# Patient Record
Sex: Male | Born: 1960 | Race: Black or African American | Hispanic: No | Marital: Single | Smoking: Current every day smoker
Health system: Southern US, Community
[De-identification: ages and names within clinical notes are randomized; demographics above are authoritative.]

## PROBLEM LIST (undated history)

## (undated) DIAGNOSIS — W3400XA Accidental discharge from unspecified firearms or gun, initial encounter: Secondary | ICD-10-CM

## (undated) HISTORY — PX: EYE SURGERY: SHX253

---

## 2004-01-04 ENCOUNTER — Emergency Department (HOSPITAL_COMMUNITY): Admission: AD | Admit: 2004-01-04 | Discharge: 2004-01-04 | Payer: Self-pay | Admitting: Family Medicine

## 2014-12-14 ENCOUNTER — Emergency Department (HOSPITAL_COMMUNITY)
Admission: EM | Admit: 2014-12-14 | Discharge: 2014-12-15 | Disposition: A | Discharge status: Home / Self Care | Payer: Self-pay | Attending: Emergency Medicine | Admitting: Emergency Medicine

## 2014-12-14 ENCOUNTER — Encounter (HOSPITAL_COMMUNITY): Payer: Self-pay | Admitting: Emergency Medicine

## 2014-12-14 DIAGNOSIS — M79661 Pain in right lower leg: Secondary | ICD-10-CM

## 2014-12-14 DIAGNOSIS — Y998 Other external cause status: Secondary | ICD-10-CM | POA: Insufficient documentation

## 2014-12-14 DIAGNOSIS — Z720 Tobacco use: Secondary | ICD-10-CM | POA: Insufficient documentation

## 2014-12-14 DIAGNOSIS — Y9389 Activity, other specified: Secondary | ICD-10-CM | POA: Insufficient documentation

## 2014-12-14 DIAGNOSIS — S3992XA Unspecified injury of lower back, initial encounter: Secondary | ICD-10-CM | POA: Insufficient documentation

## 2014-12-14 DIAGNOSIS — S80811A Abrasion, right lower leg, initial encounter: Secondary | ICD-10-CM | POA: Insufficient documentation

## 2014-12-14 DIAGNOSIS — Y92410 Unspecified street and highway as the place of occurrence of the external cause: Secondary | ICD-10-CM | POA: Insufficient documentation
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
## 2014-12-15 ENCOUNTER — Emergency Department (HOSPITAL_COMMUNITY): Payer: Self-pay

## 2014-12-15 MED ORDER — IBUPROFEN 600 MG PO TABS: 600.0000 mg | ORAL_TABLET | Freq: Four times a day (QID) | ORAL | Status: DC | PRN

## 2015-12-06 ENCOUNTER — Emergency Department (HOSPITAL_COMMUNITY)
Admission: EM | Admit: 2015-12-06 | Discharge: 2015-12-06 | Disposition: A | Discharge status: Home / Self Care | Payer: No Typology Code available for payment source | Attending: Emergency Medicine | Admitting: Emergency Medicine

## 2015-12-06 ENCOUNTER — Encounter (HOSPITAL_COMMUNITY): Payer: Self-pay | Admitting: Emergency Medicine

## 2015-12-06 ENCOUNTER — Emergency Department (HOSPITAL_COMMUNITY): Payer: No Typology Code available for payment source

## 2015-12-06 DIAGNOSIS — Y92410 Unspecified street and highway as the place of occurrence of the external cause: Secondary | ICD-10-CM | POA: Insufficient documentation

## 2015-12-06 DIAGNOSIS — S199XXA Unspecified injury of neck, initial encounter: Secondary | ICD-10-CM | POA: Insufficient documentation

## 2015-12-06 DIAGNOSIS — S4991XA Unspecified injury of right shoulder and upper arm, initial encounter: Secondary | ICD-10-CM | POA: Insufficient documentation

## 2015-12-06 DIAGNOSIS — M542 Cervicalgia: Secondary | ICD-10-CM

## 2015-12-06 DIAGNOSIS — M545 Low back pain, unspecified: Secondary | ICD-10-CM

## 2015-12-06 DIAGNOSIS — Y9389 Activity, other specified: Secondary | ICD-10-CM | POA: Insufficient documentation

## 2015-12-06 DIAGNOSIS — Y998 Other external cause status: Secondary | ICD-10-CM | POA: Insufficient documentation

## 2015-12-06 DIAGNOSIS — F17200 Nicotine dependence, unspecified, uncomplicated: Secondary | ICD-10-CM | POA: Insufficient documentation

## 2015-12-06 DIAGNOSIS — S3992XA Unspecified injury of lower back, initial encounter: Secondary | ICD-10-CM | POA: Insufficient documentation

## 2015-12-06 MED ORDER — METHOCARBAMOL 500 MG PO TABS: 500.0000 mg | ORAL_TABLET | Freq: Two times a day (BID) | ORAL | Status: DC

## 2015-12-06 MED ORDER — ACETAMINOPHEN 325 MG PO TABS
650.0000 mg | ORAL_TABLET | Freq: Once | ORAL | Status: AC
  Administered 2015-12-06: 650 mg via ORAL
  Filled 2015-12-06: qty 2
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
## 2015-12-06 NOTE — ED Notes (Signed)
Patient transported to X-ray 

## 2016-05-20 ENCOUNTER — Ambulatory Visit (HOSPITAL_COMMUNITY)
Admission: EM | Admit: 2016-05-20 | Discharge: 2016-05-20 | Disposition: A | Discharge status: Home / Self Care | Payer: 59 | Attending: Emergency Medicine | Admitting: Emergency Medicine

## 2016-05-20 ENCOUNTER — Encounter (HOSPITAL_COMMUNITY): Payer: Self-pay | Admitting: Emergency Medicine

## 2016-05-20 ENCOUNTER — Ambulatory Visit (INDEPENDENT_AMBULATORY_CARE_PROVIDER_SITE_OTHER): Payer: 59

## 2016-05-20 DIAGNOSIS — M5410 Radiculopathy, site unspecified: Secondary | ICD-10-CM | POA: Diagnosis not present

## 2016-05-20 DIAGNOSIS — S39012A Strain of muscle, fascia and tendon of lower back, initial encounter: Secondary | ICD-10-CM

## 2016-05-20 MED ORDER — PREDNISONE 10 MG PO TABS: 20.0000 mg | ORAL_TABLET | Freq: Every day | ORAL | 0 refills | Status: DC

## 2016-05-20 MED ORDER — CYCLOBENZAPRINE HCL 10 MG PO TABS: 10.0000 mg | ORAL_TABLET | Freq: Two times a day (BID) | ORAL | 0 refills | Status: DC | PRN

## 2016-05-20 MED ORDER — OXYCODONE-ACETAMINOPHEN 10-325 MG PO TABS: 1.0000 | ORAL_TABLET | ORAL | 0 refills | Status: DC | PRN

## 2016-05-20 MED ORDER — KETOROLAC TROMETHAMINE 30 MG/ML IJ SOLN
INTRAMUSCULAR | Status: AC
  Filled 2016-05-20: qty 1

## 2016-05-20 MED ORDER — KETOROLAC TROMETHAMINE 30 MG/ML IJ SOLN
30.0000 mg | Freq: Once | INTRAMUSCULAR | Status: AC
  Administered 2016-05-20: 30 mg via INTRAMUSCULAR
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
## 2016-05-20 NOTE — ED Triage Notes (Signed)
[facility] a complaint of lower back pain that radiates into his left leg for the last 3 days. The patient denied any known injury to his back.

## 2016-11-16 ENCOUNTER — Emergency Department (HOSPITAL_COMMUNITY)
Admission: EM | Admit: 2016-11-16 | Discharge: 2016-11-16 | Disposition: A | Discharge status: Home / Self Care | Payer: 59 | Attending: Emergency Medicine | Admitting: Emergency Medicine

## 2016-11-16 ENCOUNTER — Encounter (HOSPITAL_COMMUNITY): Payer: Self-pay | Admitting: Emergency Medicine

## 2016-11-16 DIAGNOSIS — F17200 Nicotine dependence, unspecified, uncomplicated: Secondary | ICD-10-CM | POA: Insufficient documentation

## 2016-11-16 DIAGNOSIS — H1032 Unspecified acute conjunctivitis, left eye: Secondary | ICD-10-CM | POA: Insufficient documentation

## 2016-11-16 DIAGNOSIS — H11422 Conjunctival edema, left eye: Secondary | ICD-10-CM

## 2016-11-16 MED ORDER — TETRACAINE HCL 0.5 % OP SOLN
OPHTHALMIC | Status: AC
  Administered 2016-11-16: 1 [drp] via OPHTHALMIC
  Filled 2016-11-16: qty 4

## 2016-11-16 MED ORDER — FLUORESCEIN SODIUM 0.6 MG OP STRP
ORAL_STRIP | OPHTHALMIC | Status: AC
  Filled 2016-11-16: qty 1

## 2016-11-16 MED ORDER — TETRACAINE HCL 0.5 % OP SOLN
1.0000 [drp] | Freq: Once | OPHTHALMIC | Status: AC
  Administered 2016-11-16: 1 [drp] via OPHTHALMIC

## 2016-11-16 MED ORDER — TOBRAMYCIN 0.3 % OP SOLN
1.0000 [drp] | Freq: Once | OPHTHALMIC | Status: AC
  Administered 2016-11-16: 1 [drp] via OPHTHALMIC
  Filled 2016-11-16: qty 5

## 2016-11-16 MED ORDER — FLUORESCEIN SODIUM 0.6 MG OP STRP
1.0000 | ORAL_STRIP | Freq: Once | OPHTHALMIC | Status: AC
  Administered 2016-11-16: 1 via OPHTHALMIC

## 2016-11-16 MED ORDER — IBUPROFEN 800 MG PO TABS
800.0000 mg | ORAL_TABLET | Freq: Once | ORAL | Status: AC
  Administered 2016-11-16: 800 mg via ORAL
  Filled 2016-11-16: qty 1

## 2016-11-16 NOTE — Discharge Instructions (Signed)
Your exam is consistent with a probable viral conjunctivitis, however you are being treated with antibiotics in case your infection is caused by a bacteria.  Apply one drop of the tobrex medication in both your eyes (if you don't treat the right one, it will become infected too).  Apply this medicine every 4 hours while awake for the next 7 days.  Avoid rubbing your eye, which will make the swelling worse.  Use a warm compress which can provide relief.

## 2016-11-16 NOTE — ED Provider Notes (Signed)
[facility]-EMERGENCY DEPT Provider Note   CSN: [id number] Arrival date & time: 11/16/16  0746     History   Chief Complaint Chief Complaint  Patient presents with  . Eye Pain    HPI William Mathis is a 56 y.o. male who presents to the ED today with a red, painful left eye that he woke with yesterday morning. Pt denies trauma or known foreign body, but does describe a sensation "like glass" in the eye with movement. He endorses a mild bilateral frontal headache, photophobia, and copious clear discharge. Pt denies change in visual acuity. The right eye is unaffected. Pt has tried an eyedrop given by a friend with no relief. He is unsure what type of drop he was using. Of note, the pt states that he had "the flu" last week with sneezing, cough, and congestion symptoms.   The history is provided by the patient.    History reviewed. No pertinent past medical history.  There are no active problems to display for this patient.   History reviewed. No pertinent surgical history.     Home Medications    Prior to Admission medications   Medication Sig Start Date End Date Taking? Authorizing Provider  cyclobenzaprine (FLEXERIL) 10 MG tablet Take 1 tablet (10 mg total) by mouth 2 (two) times daily as needed for muscle spasms. 05/20/16   Tharon AquasFrank C Patrick, PA  ibuprofen (ADVIL,MOTRIN) 600 MG tablet Take 1 tablet (600 mg total) by mouth every 6 (six) hours as needed. 12/15/14   Junius FinnerErin O'Malley, PA-C  methocarbamol (ROBAXIN) 500 MG tablet Take 1 tablet (500 mg total) by mouth 2 (two) times daily. 12/06/15   Stevi Barrett, PA-C  oxyCODONE-acetaminophen (PERCOCET) 10-325 MG tablet Take 1 tablet by mouth every 4 (four) hours as needed for pain. 05/20/16   Tharon AquasFrank C Patrick, PA  predniSONE (DELTASONE) 10 MG tablet Take 2 tablets (20 mg total) by mouth daily. 05/20/16   Tharon AquasFrank C Patrick, PA    Family History No family history on file.  Social History Social History  Substance Use Topics  . Smoking status: Current  Every Day Smoker  . Smokeless tobacco: Never Used  . Alcohol use No     Allergies   Patient has no known allergies.   Review of Systems Review of Systems  Constitutional: Negative for fever.  HENT: Negative for congestion and sore throat.   Eyes: Positive for photophobia, pain, discharge and redness. Negative for visual disturbance.  Respiratory: Negative.   Cardiovascular: Negative.   Gastrointestinal: Negative.  Negative for nausea.  Skin: Negative for rash.  Neurological: Positive for headaches. Negative for light-headedness.       Frontal headache  Psychiatric/Behavioral: Negative.      Physical Exam Updated Vital Signs BP 130/70   Pulse 71   Temp 98.4 F (36.9 C) (Oral)   Resp 16   Ht 6' (1.829 m)   Wt 79.4 kg   SpO2 100%   BMI 23.73 kg/m   Physical Exam  Constitutional: He is oriented to person, place, and time. He appears well-developed and well-nourished.  Room smells mildly of marijuana  HENT:  Head: Normocephalic and atraumatic.  Eyes: Lids are normal. Pupils are equal, round, and reactive to light. Left eye exhibits chemosis and discharge. Left conjunctiva is injected. Left eye exhibits normal extraocular motion and no nystagmus.  Slit lamp exam:      The left eye shows no corneal abrasion, no corneal flare, no corneal ulcer, no foreign body, no hyphema and  no fluorescein uptake.  Bilateral pupils mid dilated in darkened room,  Both equally reactive to light.   Visual Acuity  (pt reports stable) Bilateral Distance: 20/50 R Distance: 20/100 L Distance: 20/50    Ocular pressure measured x 3 - 21, 20, 18.      Neck: Normal range of motion.  Cardiovascular: Normal rate.   Pulmonary/Chest: Effort normal.  Musculoskeletal: Normal range of motion.  Neurological: He is alert and oriented to person, place, and time.  Skin: Skin is warm and dry.  Psychiatric: He has a normal mood and affect.  Nursing note and vitals reviewed.    ED Treatments /  Results  Labs (all labs ordered are listed, but only abnormal results are displayed) Labs Reviewed - No data to display  EKG  EKG Interpretation None       Radiology No results found.  Procedures Procedures (including critical care time)  Medications Ordered in ED Medications  tetracaine (PONTOCAINE) 0.5 % ophthalmic solution 1 drop (1 drop Left Eye Given by Other 11/16/16 0835)  fluorescein ophthalmic strip 1 strip (1 strip Left Eye Given 11/16/16 0845)  ibuprofen (ADVIL,MOTRIN) tablet 800 mg (800 mg Oral Given 11/16/16 1027)  tobramycin (TOBREX) 0.3 % ophthalmic solution 1 drop (1 drop Both Eyes Given 11/16/16 1027)     Initial Impression / Assessment and Plan / ED Course  I have reviewed the triage vital signs and the nursing notes.  Pertinent labs & imaging results that were available during my care of the patient were reviewed by me and considered in my medical decision making (see chart for details).     Pt with moderate resolution of pain after instilling tetracaine ophthalmic drops (from 10/10 to 3/10).  No fb, no corneal abrasion per fluorescein exam, no increased eye pressure.  Also no consensual pain to light.  Will tx with tobrex, pain medicine.  Advised f/u with ophthalmology if sx persist or worsen.  Also advised he should have a formal eye exam and will probably benefit from glasses (states he used to wear them).    The patient appears reasonably screened and/or stabilized for discharge and I doubt any other medical condition or other [facility] requiring further screening, evaluation, or treatment in the ED at this time prior to discharge.   Final Clinical Impressions(s) / ED Diagnoses   Final diagnoses:  Acute conjunctivitis of left eye, unspecified acute conjunctivitis type  Chemosis of left conjunctiva    New Prescriptions Discharge Medication List as of 11/16/2016 10:51 AM       Burgess Amor, PA-C 11/17/16 1610    Raeford Razor, MD 11/17/16 1327

## 2016-11-16 NOTE — ED Triage Notes (Signed)
Patient complains of left eye pain and burning with drainage that started yesterday.

## 2019-04-02 ENCOUNTER — Emergency Department (HOSPITAL_COMMUNITY)
Admission: EM | Admit: 2019-04-02 | Discharge: 2019-04-02 | Disposition: A | Discharge status: Home / Self Care | Payer: No Typology Code available for payment source | Attending: Emergency Medicine | Admitting: Emergency Medicine

## 2019-04-02 ENCOUNTER — Emergency Department (HOSPITAL_COMMUNITY): Payer: No Typology Code available for payment source

## 2019-04-02 ENCOUNTER — Encounter (HOSPITAL_COMMUNITY): Payer: Self-pay

## 2019-04-02 DIAGNOSIS — Z79899 Other long term (current) drug therapy: Secondary | ICD-10-CM | POA: Insufficient documentation

## 2019-04-02 DIAGNOSIS — Y939 Activity, unspecified: Secondary | ICD-10-CM | POA: Diagnosis not present

## 2019-04-02 DIAGNOSIS — M7918 Myalgia, other site: Secondary | ICD-10-CM | POA: Diagnosis not present

## 2019-04-02 DIAGNOSIS — Y999 Unspecified external cause status: Secondary | ICD-10-CM | POA: Insufficient documentation

## 2019-04-02 DIAGNOSIS — F17210 Nicotine dependence, cigarettes, uncomplicated: Secondary | ICD-10-CM | POA: Insufficient documentation

## 2019-04-02 HISTORY — DX: Accidental discharge from unspecified firearms or gun, initial encounter: W34.00XA

## 2019-04-02 MED ORDER — METHOCARBAMOL 500 MG PO TABS: 1000.0000 mg | ORAL_TABLET | Freq: Four times a day (QID) | ORAL | 0 refills | Status: DC

## 2019-04-02 MED ORDER — NAPROXEN 500 MG PO TABS: 500.0000 mg | ORAL_TABLET | Freq: Two times a day (BID) | ORAL | 0 refills | Status: DC

## 2019-04-02 MED ORDER — ACETAMINOPHEN 325 MG PO TABS
650.0000 mg | ORAL_TABLET | Freq: Once | ORAL | Status: AC
Start: 2019-04-02 — End: 2019-04-02
  Administered 2019-04-02: 650 mg via ORAL
  Filled 2019-04-02: qty 2

## 2019-04-02 NOTE — ED Notes (Signed)
ED Provider at bedside. 
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
## 2019-04-02 NOTE — ED Notes (Signed)
All appropriate discharge materials reviewed with patient at length. Time for questions provided. Pt denies any further questions at this time. Verbalizes understanding of all provided materials.  

## 2019-04-02 NOTE — ED Notes (Signed)
Pt back from X-ray.  

## 2019-04-02 NOTE — ED Notes (Signed)
Patient transported to X-ray 
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
## 2019-12-03 ENCOUNTER — Encounter (HOSPITAL_COMMUNITY): Payer: Self-pay

## 2019-12-03 ENCOUNTER — Other Ambulatory Visit: Payer: Self-pay

## 2019-12-03 ENCOUNTER — Ambulatory Visit (HOSPITAL_COMMUNITY)
Admission: EM | Admit: 2019-12-03 | Discharge: 2019-12-03 | Disposition: A | Discharge status: Home / Self Care | Payer: Self-pay

## 2019-12-03 DIAGNOSIS — M549 Dorsalgia, unspecified: Secondary | ICD-10-CM

## 2019-12-03 DIAGNOSIS — G8929 Other chronic pain: Secondary | ICD-10-CM
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
## 2019-12-03 NOTE — ED Triage Notes (Signed)
Pt present severe back pain on his right side. Symptoms started 8 days ago. Pt tried OTC with no relief.

## 2019-12-03 NOTE — Discharge Instructions (Addendum)
Please follow up with your primary care provider  I have advised that ibuprofen or tylenol would best help treat your pain.

## 2020-03-31 ENCOUNTER — Other Ambulatory Visit: Payer: Self-pay

## 2020-03-31 ENCOUNTER — Emergency Department (HOSPITAL_COMMUNITY)
Admission: EM | Admit: 2020-03-31 | Discharge: 2020-04-01 | Disposition: A | Discharge status: Home / Self Care | Payer: Self-pay | Attending: Emergency Medicine | Admitting: Emergency Medicine

## 2020-03-31 ENCOUNTER — Encounter (HOSPITAL_COMMUNITY): Payer: Self-pay

## 2020-03-31 DIAGNOSIS — Z5321 Procedure and treatment not carried out due to patient leaving prior to being seen by health care provider: Secondary | ICD-10-CM | POA: Insufficient documentation
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
## 2021-07-30 ENCOUNTER — Encounter (HOSPITAL_COMMUNITY): Payer: Self-pay | Admitting: Emergency Medicine

## 2021-07-30 ENCOUNTER — Other Ambulatory Visit: Payer: Self-pay

## 2021-07-30 ENCOUNTER — Ambulatory Visit (HOSPITAL_COMMUNITY)
Admission: EM | Admit: 2021-07-30 | Discharge: 2021-07-30 | Disposition: A | Discharge status: Home / Self Care | Payer: Self-pay | Attending: Emergency Medicine | Admitting: Emergency Medicine

## 2021-07-30 DIAGNOSIS — K047 Periapical abscess without sinus: Secondary | ICD-10-CM

## 2021-07-30 MED ORDER — LIDOCAINE VISCOUS HCL 2 % MT SOLN: 15.0000 mL | OROMUCOSAL | 0 refills | Status: DC | PRN

## 2021-07-30 MED ORDER — HYDROCODONE-ACETAMINOPHEN 5-325 MG PO TABS
1.0000 | ORAL_TABLET | Freq: Once | ORAL | Status: AC
  Administered 2021-07-30: 1 via ORAL

## 2021-07-30 MED ORDER — HYDROCODONE-ACETAMINOPHEN 5-325 MG PO TABS
ORAL_TABLET | ORAL | Status: AC
  Filled 2021-07-30: qty 1

## 2021-07-30 MED ORDER — IBUPROFEN 800 MG PO TABS: 800.0000 mg | ORAL_TABLET | Freq: Three times a day (TID) | ORAL | 0 refills | Status: DC

## 2021-07-30 MED ORDER — AMOXICILLIN-POT CLAVULANATE 875-125 MG PO TABS: 1.0000 | ORAL_TABLET | Freq: Two times a day (BID) | ORAL | 0 refills | Status: DC
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
## 2021-07-30 NOTE — ED Triage Notes (Signed)
Pt is present today with an abscess in the left side of his lower gum . Pt states that he noticed the abscess in June.

## 2021-07-30 NOTE — Discharge Instructions (Addendum)
Inside of your packet is a list of local dentist, please follow-up for evaluation of your teeth  Take Augmentin twice a day for the next 7 days  Can use lidocaine solution every 4 hours as needed, gargle and spit  May use ibuprofen every 8 hours as needed for additional comfort.  Attempt to maintain  good dental hygiene, brushing teeth twice a day, flossing daily and can use antibacterial oral solution as well  May follow-up with urgent care as needed

## 2021-08-07 ENCOUNTER — Ambulatory Visit (HOSPITAL_COMMUNITY)
Admission: EM | Admit: 2021-08-07 | Discharge: 2021-08-07 | Disposition: A | Discharge status: Home / Self Care | Payer: Self-pay | Attending: Emergency Medicine | Admitting: Emergency Medicine

## 2021-08-07 ENCOUNTER — Other Ambulatory Visit: Payer: Self-pay

## 2021-08-07 ENCOUNTER — Encounter (HOSPITAL_COMMUNITY): Payer: Self-pay | Admitting: Emergency Medicine

## 2021-08-07 DIAGNOSIS — H1032 Unspecified acute conjunctivitis, left eye: Secondary | ICD-10-CM

## 2021-08-07 MED ORDER — ERYTHROMYCIN 5 MG/GM OP OINT: TOPICAL_OINTMENT | OPHTHALMIC | 0 refills | Status: DC

## 2021-08-07 NOTE — Discharge Instructions (Addendum)
Use erythromycin ointment 1/2 inch to the lower eyelid 4 times a day for the next 5 to 7 days.  If the symptoms have completely resolved after 5 days you may stop the antibiotic ointment otherwise continue for the full 7 days.  Wash pillow cases, wash hands regularly with soap and water, avoid touching your face and eyes, wash door handles, light switches, remotes and other objects you frequently touch Return or follow up with PCP if symptoms persists such as fever, chills, redness, swelling, eye pain, painful eye movements, vision changes.
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
## 2021-08-07 NOTE — ED Triage Notes (Signed)
Pt c/o left eye drainage and crusted over when woke up today.

## 2021-09-10 ENCOUNTER — Encounter (HOSPITAL_COMMUNITY): Payer: Self-pay | Admitting: Emergency Medicine

## 2021-09-10 ENCOUNTER — Observation Stay (HOSPITAL_COMMUNITY)
Admission: EM | Admit: 2021-09-10 | Discharge: 2021-09-11 | Disposition: A | Discharge status: Home / Self Care | Payer: Self-pay | Attending: Internal Medicine | Admitting: Internal Medicine

## 2021-09-10 DIAGNOSIS — T40604A Poisoning by unspecified narcotics, undetermined, initial encounter: Secondary | ICD-10-CM

## 2021-09-10 DIAGNOSIS — G929 Unspecified toxic encephalopathy: Secondary | ICD-10-CM | POA: Insufficient documentation

## 2021-09-10 DIAGNOSIS — F1910 Other psychoactive substance abuse, uncomplicated: Secondary | ICD-10-CM

## 2021-09-10 DIAGNOSIS — E8809 Other disorders of plasma-protein metabolism, not elsewhere classified: Secondary | ICD-10-CM

## 2021-09-10 DIAGNOSIS — T401X1A Poisoning by heroin, accidental (unintentional), initial encounter: Principal | ICD-10-CM | POA: Insufficient documentation

## 2021-09-10 DIAGNOSIS — Z20822 Contact with and (suspected) exposure to covid-19: Secondary | ICD-10-CM | POA: Insufficient documentation

## 2021-09-10 DIAGNOSIS — F1090 Alcohol use, unspecified, uncomplicated: Secondary | ICD-10-CM

## 2021-09-10 DIAGNOSIS — Z789 Other specified health status: Secondary | ICD-10-CM

## 2021-09-10 DIAGNOSIS — F1110 Opioid abuse, uncomplicated: Secondary | ICD-10-CM | POA: Diagnosis present

## 2021-09-10 DIAGNOSIS — R718 Other abnormality of red blood cells: Secondary | ICD-10-CM

## 2021-09-10 DIAGNOSIS — J9601 Acute respiratory failure with hypoxia: Secondary | ICD-10-CM | POA: Insufficient documentation

## 2021-09-10 HISTORY — DX: Accidental discharge from unspecified firearms or gun, initial encounter: W34.00XA

## 2021-09-10 LAB — COMPREHENSIVE METABOLIC PANEL
ALT: 22 U/L (ref 0–44)
AST: 49 U/L — ABNORMAL HIGH (ref 15–41)
Albumin: 3.4 g/dL — ABNORMAL LOW (ref 3.5–5.0)
Alkaline Phosphatase: 72 U/L (ref 38–126)
Anion gap: 11 (ref 5–15)
BUN: 11 mg/dL (ref 6–20)
CO2: 26 mmol/L (ref 22–32)
Calcium: 8.6 mg/dL — ABNORMAL LOW (ref 8.9–10.3)
Chloride: 100 mmol/L (ref 98–111)
Creatinine, Ser: 0.77 mg/dL (ref 0.61–1.24)
GFR, Estimated: >60 mL/min (ref >60)
Glucose, Bld: 114 mg/dL — ABNORMAL HIGH (ref 70–99)
Potassium: 4 mmol/L (ref 3.5–5.1)
Sodium: 137 mmol/L (ref 135–145)
Total Bilirubin: 0.4 mg/dL (ref 0.3–1.2)
Total Protein: 7 g/dL (ref 6.5–8.1)

## 2021-09-10 LAB — RAPID URINE DRUG SCREEN, HOSP PERFORMED
Amphetamines: NOT DETECTED
Barbiturates: NOT DETECTED
Benzodiazepines: POSITIVE — AB
Cocaine: NOT DETECTED
Opiates: NOT DETECTED
Tetrahydrocannabinol: POSITIVE — AB

## 2021-09-10 LAB — CBC WITH DIFFERENTIAL/PLATELET
Abs Immature Granulocytes: 0.03 10*3/uL (ref 0.00–0.07)
Basophils Absolute: 0 10*3/uL (ref 0.0–0.1)
Basophils Relative: 0 %
Eosinophils Absolute: 0 10*3/uL (ref 0.0–0.5)
Eosinophils Relative: 0 %
HCT: 39.6 % (ref 39.0–52.0)
Hemoglobin: 13.2 g/dL (ref 13.0–17.0)
Immature Granulocytes: 0 %
Lymphocytes Relative: 13 %
Lymphs Abs: 1.2 10*3/uL (ref 0.7–4.0)
MCH: 35.1 pg — ABNORMAL HIGH (ref 26.0–34.0)
MCHC: 33.3 g/dL (ref 30.0–36.0)
MCV: 105.3 fL — ABNORMAL HIGH (ref 80.0–100.0)
Monocytes Absolute: 0.7 10*3/uL (ref 0.1–1.0)
Monocytes Relative: 8 %
Neutro Abs: 7.1 10*3/uL (ref 1.7–7.7)
Neutrophils Relative %: 79 %
Platelets: 288 10*3/uL (ref 150–400)
RBC: 3.76 MIL/uL — ABNORMAL LOW (ref 4.22–5.81)
RDW: 13.3 % (ref 11.5–15.5)
WBC: 9.1 10*3/uL (ref 4.0–10.5)
nRBC: 0 % (ref 0.0–0.2)

## 2021-09-10 LAB — ETHANOL: Alcohol, Ethyl (B): 17 mg/dL — ABNORMAL HIGH (ref <10)

## 2021-09-10 LAB — RESP PANEL BY RT-PCR (FLU A&B, COVID) ARPGX2
Influenza A by PCR: NEGATIVE
Influenza B by PCR: NEGATIVE
SARS Coronavirus 2 by RT PCR: NEGATIVE

## 2021-09-10 MED ORDER — ONDANSETRON HCL 4 MG/2ML IJ SOLN
4.0000 mg | Freq: Once | INTRAMUSCULAR | Status: AC
  Administered 2021-09-10: 4 mg via INTRAVENOUS
  Filled 2021-09-10: qty 2

## 2021-09-10 MED ORDER — NALOXONE HCL 0.4 MG/ML IJ SOLN
0.4000 mg | Freq: Once | INTRAMUSCULAR | Status: AC
  Administered 2021-09-10: 0.4 mg via INTRAVENOUS

## 2021-09-10 NOTE — ED Notes (Addendum)
This RN went in to assess patient and seen pt respirations were 6 and respirations in 80s. EDP was made aware and orders given. Pt is now more alert with respirations in 20's as well as sats in 90s.
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
## 2021-09-11 ENCOUNTER — Encounter (HOSPITAL_COMMUNITY): Payer: Self-pay | Admitting: Emergency Medicine

## 2021-09-11 DIAGNOSIS — F1910 Other psychoactive substance abuse, uncomplicated: Secondary | ICD-10-CM

## 2021-09-11 DIAGNOSIS — F1110 Opioid abuse, uncomplicated: Secondary | ICD-10-CM

## 2021-09-11 DIAGNOSIS — R718 Other abnormality of red blood cells: Secondary | ICD-10-CM

## 2021-09-11 DIAGNOSIS — E8809 Other disorders of plasma-protein metabolism, not elsewhere classified: Secondary | ICD-10-CM

## 2021-09-11 DIAGNOSIS — Z789 Other specified health status: Secondary | ICD-10-CM

## 2021-09-11 LAB — CBC
HCT: 38.2 % — ABNORMAL LOW (ref 39.0–52.0)
Hemoglobin: 12.8 g/dL — ABNORMAL LOW (ref 13.0–17.0)
MCH: 35.2 pg — ABNORMAL HIGH (ref 26.0–34.0)
MCHC: 33.5 g/dL (ref 30.0–36.0)
MCV: 104.9 fL — ABNORMAL HIGH (ref 80.0–100.0)
Platelets: 261 10*3/uL (ref 150–400)
RBC: 3.64 MIL/uL — ABNORMAL LOW (ref 4.22–5.81)
RDW: 13.4 % (ref 11.5–15.5)
WBC: 8 10*3/uL (ref 4.0–10.5)
nRBC: 0 % (ref 0.0–0.2)

## 2021-09-11 LAB — COMPREHENSIVE METABOLIC PANEL
ALT: 20 U/L (ref 0–44)
AST: 32 U/L (ref 15–41)
Albumin: 3.4 g/dL — ABNORMAL LOW (ref 3.5–5.0)
Alkaline Phosphatase: 73 U/L (ref 38–126)
Anion gap: 6 (ref 5–15)
BUN: 11 mg/dL (ref 6–20)
CO2: 29 mmol/L (ref 22–32)
Calcium: 8.7 mg/dL — ABNORMAL LOW (ref 8.9–10.3)
Chloride: 100 mmol/L (ref 98–111)
Creatinine, Ser: 0.86 mg/dL (ref 0.61–1.24)
GFR, Estimated: >60 mL/min (ref >60)
Glucose, Bld: 103 mg/dL — ABNORMAL HIGH (ref 70–99)
Potassium: 4.3 mmol/L (ref 3.5–5.1)
Sodium: 135 mmol/L (ref 135–145)
Total Bilirubin: 0.9 mg/dL (ref 0.3–1.2)
Total Protein: 6.9 g/dL (ref 6.5–8.1)

## 2021-09-11 LAB — MAGNESIUM: Magnesium: 1.9 mg/dL (ref 1.7–2.4)

## 2021-09-11 LAB — PHOSPHORUS: Phosphorus: 3.8 mg/dL (ref 2.5–4.6)

## 2021-09-11 LAB — HIV ANTIBODY (ROUTINE TESTING W REFLEX): HIV Screen 4th Generation wRfx: NONREACTIVE

## 2021-09-11 LAB — APTT: aPTT: 24 seconds (ref 24–36)

## 2021-09-11 LAB — FOLATE: Folate: 11.1 ng/mL (ref >5.9)

## 2021-09-11 LAB — VITAMIN B12: Vitamin B-12: 136 pg/mL — ABNORMAL LOW (ref 180–914)

## 2021-09-11 MED ORDER — ENSURE ENLIVE PO LIQD: 237.0000 mL | Freq: Two times a day (BID) | ORAL | Status: DC

## 2021-09-11 MED ORDER — ENOXAPARIN SODIUM 40 MG/0.4ML IJ SOSY: 40.0000 mg | PREFILLED_SYRINGE | INTRAMUSCULAR | Status: DC
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
## 2021-09-11 NOTE — ED Notes (Signed)
CSW notified by MD that pt was in need of a PCP. CSW noted that pt has no insurance. CSW spoke with pt in room about [facility] and his interest in a referral being made. Pt states that he is interested in this, CSW left [facility] paper in room with pt as well. Referral made to [facility].

## 2022-03-31 ENCOUNTER — Encounter (HOSPITAL_COMMUNITY): Payer: Self-pay | Admitting: Emergency Medicine

## 2022-03-31 ENCOUNTER — Emergency Department (HOSPITAL_COMMUNITY)
Admission: EM | Admit: 2022-03-31 | Discharge: 2022-03-31 | Discharge status: Against Medical Advice | Payer: Self-pay | Attending: Emergency Medicine | Admitting: Emergency Medicine

## 2022-03-31 ENCOUNTER — Emergency Department (HOSPITAL_COMMUNITY): Payer: Self-pay

## 2022-03-31 ENCOUNTER — Other Ambulatory Visit: Payer: Self-pay

## 2022-03-31 DIAGNOSIS — R079 Chest pain, unspecified: Secondary | ICD-10-CM

## 2022-03-31 DIAGNOSIS — R0602 Shortness of breath: Secondary | ICD-10-CM | POA: Insufficient documentation

## 2022-03-31 DIAGNOSIS — R059 Cough, unspecified: Secondary | ICD-10-CM | POA: Insufficient documentation

## 2022-03-31 LAB — CBC WITH DIFFERENTIAL/PLATELET
Abs Immature Granulocytes: 0.01 10*3/uL (ref 0.00–0.07)
Basophils Absolute: 0 10*3/uL (ref 0.0–0.1)
Basophils Relative: 1 %
Eosinophils Absolute: 0.1 10*3/uL (ref 0.0–0.5)
Eosinophils Relative: 2 %
HCT: 41.5 % (ref 39.0–52.0)
Hemoglobin: 13.6 g/dL (ref 13.0–17.0)
Immature Granulocytes: 0 %
Lymphocytes Relative: 33 %
Lymphs Abs: 2 10*3/uL (ref 0.7–4.0)
MCH: 34.1 pg — ABNORMAL HIGH (ref 26.0–34.0)
MCHC: 32.8 g/dL (ref 30.0–36.0)
MCV: 104 fL — ABNORMAL HIGH (ref 80.0–100.0)
Monocytes Absolute: 0.6 10*3/uL (ref 0.1–1.0)
Monocytes Relative: 9 %
Neutro Abs: 3.3 10*3/uL (ref 1.7–7.7)
Neutrophils Relative %: 55 %
Platelets: 298 10*3/uL (ref 150–400)
RBC: 3.99 MIL/uL — ABNORMAL LOW (ref 4.22–5.81)
RDW: 12.1 % (ref 11.5–15.5)
WBC: 6.1 10*3/uL (ref 4.0–10.5)
nRBC: 0 % (ref 0.0–0.2)

## 2022-03-31 LAB — COMPREHENSIVE METABOLIC PANEL
ALT: 15 U/L (ref 0–44)
AST: 21 U/L (ref 15–41)
Albumin: 3.4 g/dL — ABNORMAL LOW (ref 3.5–5.0)
Alkaline Phosphatase: 78 U/L (ref 38–126)
Anion gap: 7 (ref 5–15)
BUN: 9 mg/dL (ref 6–20)
CO2: 27 mmol/L (ref 22–32)
Calcium: 9.1 mg/dL (ref 8.9–10.3)
Chloride: 104 mmol/L (ref 98–111)
Creatinine, Ser: 0.76 mg/dL (ref 0.61–1.24)
GFR, Estimated: >60 mL/min (ref >60)
Glucose, Bld: 86 mg/dL (ref 70–99)
Potassium: 3.6 mmol/L (ref 3.5–5.1)
Sodium: 138 mmol/L (ref 135–145)
Total Bilirubin: 0.4 mg/dL (ref 0.3–1.2)
Total Protein: 7.6 g/dL (ref 6.5–8.1)

## 2022-03-31 LAB — LIPASE, BLOOD: Lipase: 49 U/L (ref 11–51)

## 2022-03-31 LAB — MAGNESIUM: Magnesium: 2 mg/dL (ref 1.7–2.4)

## 2022-03-31 LAB — TROPONIN I (HIGH SENSITIVITY): Troponin I (High Sensitivity): 3 ng/L (ref <18)

## 2022-03-31 NOTE — ED Triage Notes (Signed)
Pt states intermittent chest pain started this morning. Pain is across right and left side of chest. Pt states he coughed a lot this morning and felt like he would throw up but hasn't. Pt denies any SOB.

## 2022-03-31 NOTE — ED Provider Notes (Signed)
[facility] EMERGENCY DEPARTMENT Provider Note   CSN: [id number] Arrival date & time: 03/31/22  1900     History  Chief Complaint  Patient presents with   Chest Pain    William Mathis is a 61 y.o. male.   Chest Pain Associated symptoms: cough   Patient presents for chest pain.  Medical history includes polysubstance abuse, alcohol use.  He states that he had some anterior chest pain this morning that he attributed to some coughing.  Later in the day, while at work, he experienced recurrence of this chest pain.  He has had episodes like this in the past.  Typically it is very short-lived.  While at work, it persisted for several hours.  He denies any nausea, diaphoresis, or lightheadedness.  He does have intermittent shortness of breath which he attributes to smoking.  Currently, he denies any symptoms.  He does state that he sees a primary care doctor and did have a checkup 1 month ago.  He was told that results, at that time, were good.  He does not have any known medical conditions.  He does not have any family history of early ACS.     Home Medications Prior to Admission medications   Not on File      Allergies    Patient has no known allergies.    Review of Systems   Review of Systems  Respiratory:  Positive for cough.   Cardiovascular:  Positive for chest pain.  All other systems reviewed and are negative.   Physical Exam Updated Vital Signs BP 132/88   Pulse 80   Temp 97.7 F (36.5 C) (Oral)   Resp 14   Ht 6' (1.829 m)   Wt 72.6 kg   SpO2 98%   BMI 21.71 kg/m  Physical Exam Vitals and nursing note reviewed.  Constitutional:      General: He is not in acute distress.    Appearance: He is well-developed and normal weight. He is not ill-appearing, toxic-appearing or diaphoretic.  HENT:     Head: Normocephalic and atraumatic.  Eyes:     Conjunctiva/sclera: Conjunctivae normal.  Neck:     Vascular: No JVD.  Cardiovascular:     Rate and Rhythm: Normal rate  and regular rhythm.     Heart sounds: No murmur heard. Pulmonary:     Effort: Pulmonary effort is normal. No respiratory distress.     Breath sounds: Normal breath sounds. No wheezing, rhonchi or rales.  Chest:     Chest wall: Tenderness present.  Abdominal:     Palpations: Abdomen is soft.     Tenderness: There is no abdominal tenderness.  Musculoskeletal:        General: No swelling. Normal range of motion.     Cervical back: Normal range of motion and neck supple.     Right lower leg: No edema.     Left lower leg: No edema.  Skin:    General: Skin is warm and dry.  Neurological:     General: No focal deficit present.     Mental Status: He is alert and oriented to person, place, and time.  Psychiatric:        Mood and Affect: Mood normal.        Behavior: Behavior normal.     ED Results / Procedures / Treatments   Labs (all labs ordered are listed, but only abnormal results are displayed) Labs Reviewed  COMPREHENSIVE METABOLIC PANEL - Abnormal; Notable for the  following components:      Result Value   Albumin 3.4 (*)    All other components within normal limits  CBC WITH DIFFERENTIAL/PLATELET - Abnormal; Notable for the following components:   RBC 3.99 (*)    MCV 104.0 (*)    MCH 34.1 (*)    All other components within normal limits  MAGNESIUM  LIPASE, BLOOD  TROPONIN I (HIGH SENSITIVITY)    EKG EKG Interpretation  Date/Time:  Wednesday March 31 2022 19:25:35 EDT Ventricular Rate:  82 PR Interval:  176 QRS Duration: 74 QT Interval:  363 QTC Calculation: 424 R Axis:   66 Text Interpretation: Sinus rhythm Consider left atrial enlargement Early repolarization Confirmed by Gloris Manchester [id number]) on 03/31/2022 8:07:43 PM  Radiology DG Chest Portable 1 View  Result Date: 03/31/2022 CLINICAL DATA:  Intermittent chest pain cough EXAM: PORTABLE CHEST 1 VIEW COMPARISON:  04/02/2019 FINDINGS: Cardiac and mediastinal contours are within normal limits. No focal pulmonary  opacity. Emphysematous changes. No pleural effusion or pneumothorax. No acute osseous abnormality. IMPRESSION: Emphysema without acute cardiopulmonary process. Electronically Signed   By: Wiliam Ke M.D.   On: 03/31/2022 20:23    Procedures Procedures    Medications Ordered in ED Medications - No data to display  ED Course/ Medical Decision Making/ A&P                           Medical Decision Making Amount and/or Complexity of Data Reviewed Labs: ordered. Radiology: ordered.   This patient presents to the ED for concern of chest pain, this involves an extensive number of treatment options, and is a complaint that carries with it a high risk of complications and morbidity.  The differential diagnosis includes ACS, GERD, PUD, costochondritis, pain otitis, pneumonia, viral illness   Co morbidities that complicate the patient evaluation  Remote polysubstance abuse and alcohol use   Additional history obtained:  Additional history obtained from N/A External records from outside source obtained and reviewed including EMR   Lab Tests:  I Ordered, and personally interpreted labs.  The pertinent results include: Normal hemoglobin, no leukocytosis, normal electrolytes, normal lipase, normal troponin   Imaging Studies ordered:  I ordered imaging studies including chest x-ray I independently visualized and interpreted imaging which showed no acute findings I agree with the radiologist interpretation   Cardiac Monitoring: / EKG:  The patient was maintained on a cardiac monitor.  I personally viewed and interpreted the cardiac monitored which showed an underlying rhythm of: Sinus rhythm   Problem List / ED Course / Critical interventions / Medication management  Patient is a 61 year old male presenting for chest pain earlier today.  He states that he has had intermittent episodes of chest pain in the past.  Today's episode lasted for several hours, which is quite a bit  longer than usual.  It was not exertional.  Pain did not radiate.  He describes it as throughout the anterior aspect of his chest.  He denies any associated shortness of breath, nausea, or diaphoresis.  Patient does currently smoke but apparently has no other risk factors for ACS.  EKG shows no evidence of ST segment changes.  Laboratory work-up was initiated.  Given his absence of symptoms at this time, no analgesia was given.  Patient's lab work is reassuring, as is his chest x-ray.  Prior to informing the patient of his reassuring work-up results, patient eloped from the ED.   Social Determinants of Health:  Has PCP        Final Clinical Impression(s) / ED Diagnoses Final diagnoses:  Chest pain, unspecified type    Rx / DC Orders ED Discharge Orders     None         Gloris Manchester, MD 04/01/22 [phone]
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
## 2023-07-28 ENCOUNTER — Other Ambulatory Visit: Payer: Self-pay | Admitting: Physician Assistant

## 2023-07-28 ENCOUNTER — Encounter: Payer: Self-pay | Admitting: Physician Assistant

## 2023-07-28 ENCOUNTER — Ambulatory Visit: Payer: Self-pay | Admitting: Physician Assistant

## 2023-07-28 VITALS — BP 124/85 | HR 81 | Temp 97.2°F | Ht 72.0 in | Wt 160.0 lb

## 2023-07-28 DIAGNOSIS — Z7689 Persons encountering health services in other specified circumstances: Secondary | ICD-10-CM

## 2023-07-28 DIAGNOSIS — F17200 Nicotine dependence, unspecified, uncomplicated: Secondary | ICD-10-CM

## 2023-07-28 DIAGNOSIS — Z125 Encounter for screening for malignant neoplasm of prostate: Secondary | ICD-10-CM

## 2023-07-28 DIAGNOSIS — J439 Emphysema, unspecified: Secondary | ICD-10-CM

## 2023-07-28 DIAGNOSIS — Z1211 Encounter for screening for malignant neoplasm of colon: Secondary | ICD-10-CM

## 2023-07-28 DIAGNOSIS — Z13220 Encounter for screening for lipoid disorders: Secondary | ICD-10-CM

## 2023-07-28 NOTE — Progress Notes (Signed)
BP 124/85   Pulse 81   Temp (!) 97.2 F (36.2 C)   Ht 6' (1.829 m)   Wt 160 lb (72.6 kg)   SpO2 99%   BMI 21.70 kg/m    Subjective:    Patient ID: ROGEN PORTE, male    DOB: 1961-01-20, 62 y.o.   MRN: [id number]  HPI: TASHAWN LASWELL is a 62 y.o. male presenting on 07/28/2023 for New Patient (Initial Visit)   HPI   Chief Complaint  Patient presents with   New Patient (Initial Visit)    Pt is 62yoM who presents to establish care.  He says he has no issues.  He is Not working currently.  He usually works Gaffer kind of jobs.  He is Waiting on background check to get job at [employer].  He did not ever get a covic vacciination.  He has never had screening for colon cancer or other HCM.   He denies CP, sob, abdominal pain.  He does admit that he coughs.  2-3 times/day.  He says it comes from the cigars.   He says that he never gets sob and is very active.     Review of epic shows CXR- 03/31/22 and 04/02/19 that both showed emphysema      Relevant past medical, surgical, family and social history reviewed and updated as indicated. Interim medical history since our last visit reviewed. Allergies and medications reviewed and updated.  No current outpatient medications on file.   Review of Systems  Per HPI unless specifically indicated above     Objective:    BP 124/85   Pulse 81   Temp (!) 97.2 F (36.2 C)   Ht 6' (1.829 m)   Wt 160 lb (72.6 kg)   SpO2 99%   BMI 21.70 kg/m   Wt Readings from Last 3 Encounters:  07/28/23 160 lb (72.6 kg)  03/31/22 160 lb 0.9 oz (72.6 kg)  09/10/21 160 lb (72.6 kg)    Physical Exam Vitals reviewed.  Constitutional:      General: He is not in acute distress.    Appearance: He is well-developed. He is not toxic-appearing.  HENT:     Head: Normocephalic and atraumatic.     Right Ear: Tympanic membrane, ear canal and external ear normal.     Left Ear: Tympanic membrane, ear canal and external ear normal.  Eyes:      Extraocular Movements: Extraocular movements intact.     Conjunctiva/sclera: Conjunctivae normal.     Pupils: Pupils are equal, round, and reactive to light.  Neck:     Thyroid: No thyromegaly.  Cardiovascular:     Rate and Rhythm: Normal rate and regular rhythm.  Pulmonary:     Effort: Pulmonary effort is normal. No respiratory distress.     Breath sounds: Normal breath sounds. No stridor. No wheezing, rhonchi or rales.  Abdominal:     General: Bowel sounds are normal.     Palpations: Abdomen is soft. There is no hepatomegaly, splenomegaly, mass or pulsatile mass.     Tenderness: There is no abdominal tenderness.  Musculoskeletal:     Cervical back: Neck supple.     Right lower leg: No edema.     Left lower leg: No edema.  Lymphadenopathy:     Cervical: No cervical adenopathy.  Skin:    General: Skin is warm and dry.     Findings: No rash.  Neurological:     Mental Status: He is alert and  oriented to person, place, and time.     Coordination: Romberg sign negative.     Gait: Gait is intact. Gait normal.     Deep Tendon Reflexes:     Reflex Scores:      Patellar reflexes are 2+ on the right side and 2+ on the left side. Psychiatric:        Attention and Perception: Attention normal.        Speech: Speech normal.        Behavior: Behavior normal. Behavior is cooperative.           Assessment & Plan:   Encounter Diagnoses  Name Primary?   Encounter to establish care Yes   Pulmonary emphysema, unspecified emphysema type (HCC)    Tobacco use disorder    Screening for colon cancer    Screening for hyperlipidemia    Screening for prostate cancer      Emphysema -pt counseled on smoking cessation  HCM -check Psa and lipids -pt given FIT test for colon cancer screening   Pt is scheduled to follow up 6 months.  He is to contact office sooner prn

## 2023-08-02 ENCOUNTER — Telehealth: Payer: Self-pay

## 2023-08-02 NOTE — Telephone Encounter (Signed)
Called for follow up after client's first visit to establish care with [facility]. He states everything went well and he has no questions at this time. He knows he is to get labs drawn at [facility] before next visit and he has his colon cancer screening to turn in also.  He is aware of his next appointment in April 2025 and that he can call for any issues. He states she encouraged him to quit smoking and I reinforced that. Will plan to send some smoking cessation information.  Client has my number if he has any needs or questions.  Francee Nodal RN [employer]

## 2023-08-06 IMAGING — DX DG CHEST 1V PORT
1 series · 1 of 1 positions shown · non-contrast
Comparison: 04/02/2019

CLINICAL DATA: Intermittent chest pain cough

EXAM:
PORTABLE CHEST 1 VIEW

[chest ap]
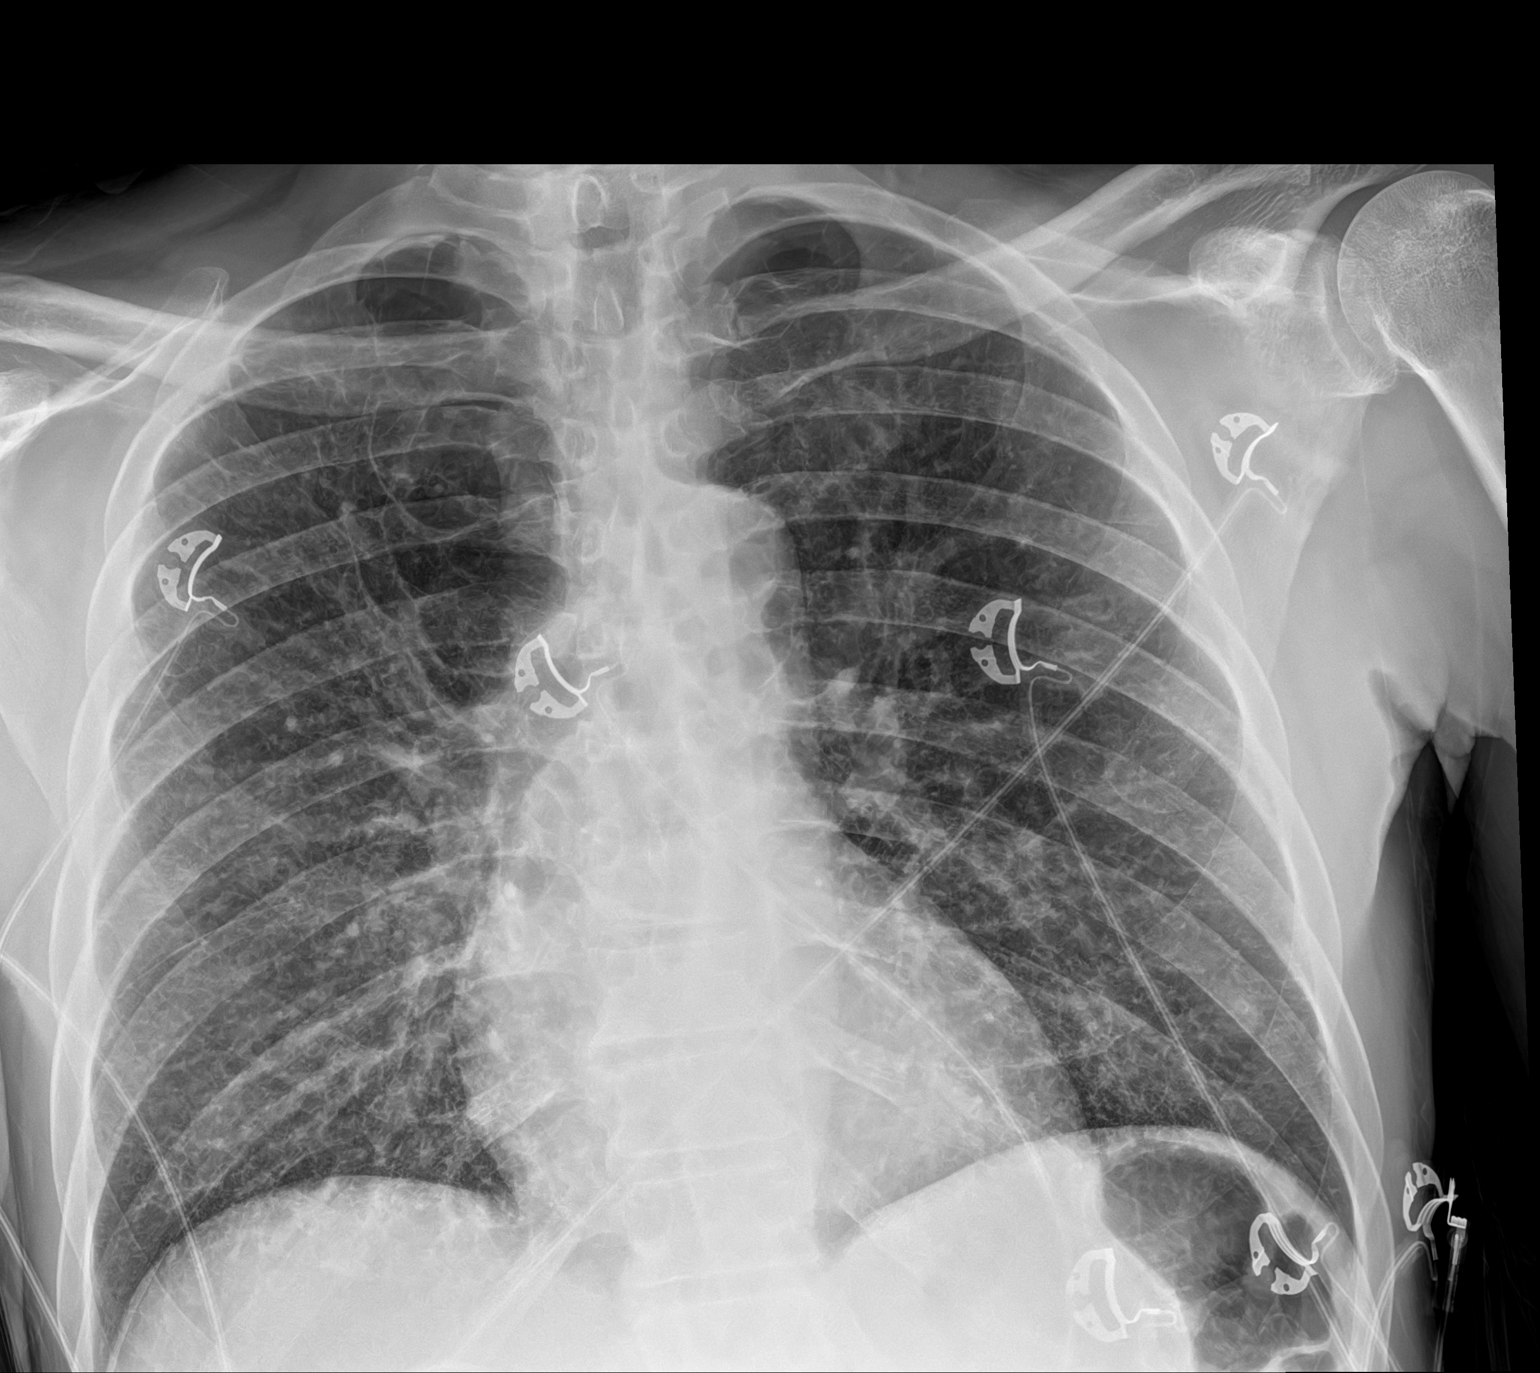

[1 of 1 positions shown; findings below may reference images not displayed]

FINDINGS: Cardiac and mediastinal contours are within normal limits. No focal
pulmonary opacity. Emphysematous changes. No pleural effusion or
pneumothorax. No acute osseous abnormality.
IMPRESSION: Emphysema without acute cardiopulmonary process.

## 2023-09-11 DIAGNOSIS — Z419 Encounter for procedure for purposes other than remedying health state, unspecified: Secondary | ICD-10-CM | POA: Diagnosis not present

## 2023-10-12 DIAGNOSIS — Z419 Encounter for procedure for purposes other than remedying health state, unspecified: Secondary | ICD-10-CM | POA: Diagnosis not present

## 2023-10-13 ENCOUNTER — Telehealth: Payer: Self-pay

## 2023-10-13 NOTE — Telephone Encounter (Signed)
 Called for follow up to [facility] client, in reviewing his notes for his next appointment, noted that he has been approved for Medicaid and will no longer be eligible for services at [facility]. He states he has not received anything in the mail. He states understanding regarding changing providers. Discussed need to select a new PCP and will send him a list in the mail along with other information.  Will also make sure [facility] change as his next appointment is scheduled for 01/25/24, called C. Adolm to notify.  Mailed client Letter about receiving Medicaid and needing to change providers from [facility] list of providers accepting new patients. Mailed Medicaid Tip sheet  Mailed sheets with tips and numbers of Medicaid plans including [employer] which is listed as his plan.   Avelina JONELLE Skeen RN [employer]

## 2023-11-12 DIAGNOSIS — Z419 Encounter for procedure for purposes other than remedying health state, unspecified: Secondary | ICD-10-CM | POA: Diagnosis not present

## 2023-12-10 DIAGNOSIS — Z419 Encounter for procedure for purposes other than remedying health state, unspecified: Secondary | ICD-10-CM | POA: Diagnosis not present

## 2024-01-21 DIAGNOSIS — Z419 Encounter for procedure for purposes other than remedying health state, unspecified: Secondary | ICD-10-CM | POA: Diagnosis not present

## 2024-01-25 ENCOUNTER — Ambulatory Visit: Payer: Self-pay | Admitting: Physician Assistant

## 2024-02-20 DIAGNOSIS — Z419 Encounter for procedure for purposes other than remedying health state, unspecified: Secondary | ICD-10-CM | POA: Diagnosis not present

## 2024-03-22 DIAGNOSIS — Z419 Encounter for procedure for purposes other than remedying health state, unspecified: Secondary | ICD-10-CM | POA: Diagnosis not present

## 2024-04-21 DIAGNOSIS — Z419 Encounter for procedure for purposes other than remedying health state, unspecified: Secondary | ICD-10-CM | POA: Diagnosis not present

## 2024-05-22 DIAGNOSIS — Z419 Encounter for procedure for purposes other than remedying health state, unspecified: Secondary | ICD-10-CM | POA: Diagnosis not present

## 2024-06-22 DIAGNOSIS — Z419 Encounter for procedure for purposes other than remedying health state, unspecified: Secondary | ICD-10-CM | POA: Diagnosis not present
